# Patient Record
Sex: Male | Born: 1965 | Race: Black or African American | Hispanic: No | Smoking: Current every day smoker
Health system: Southern US, Community
[De-identification: ages and names within clinical notes are randomized; demographics above are authoritative.]

## PROBLEM LIST (undated history)

## (undated) DIAGNOSIS — F1010 Alcohol abuse, uncomplicated: Secondary | ICD-10-CM

---

## 2006-05-02 ENCOUNTER — Emergency Department (HOSPITAL_COMMUNITY): Admission: EM | Admit: 2006-05-02 | Discharge: 2006-05-02 | Payer: Self-pay | Admitting: Emergency Medicine

## 2006-09-02 ENCOUNTER — Emergency Department (HOSPITAL_COMMUNITY): Admission: EM | Admit: 2006-09-02 | Discharge: 2006-09-02 | Payer: Self-pay | Admitting: Emergency Medicine

## 2006-09-04 ENCOUNTER — Emergency Department (HOSPITAL_COMMUNITY): Admission: EM | Admit: 2006-09-04 | Discharge: 2006-09-04 | Payer: Self-pay | Admitting: *Deleted

## 2006-09-05 ENCOUNTER — Emergency Department (HOSPITAL_COMMUNITY): Admission: EM | Admit: 2006-09-05 | Discharge: 2006-09-05 | Payer: Self-pay | Admitting: Emergency Medicine

## 2007-11-13 ENCOUNTER — Emergency Department (HOSPITAL_COMMUNITY): Admission: EM | Admit: 2007-11-13 | Discharge: 2007-11-13 | Payer: Self-pay | Admitting: Emergency Medicine

## 2008-10-14 IMAGING — CR DG ANKLE COMPLETE 3+V*L*
3 series · 3 of 3 positions shown · non-contrast
Comparison: None

CLINICAL DATA: Ankle injury

LEFT ANKLE COMPLETE - 3+ VIEW

[t ankle joint lat left]
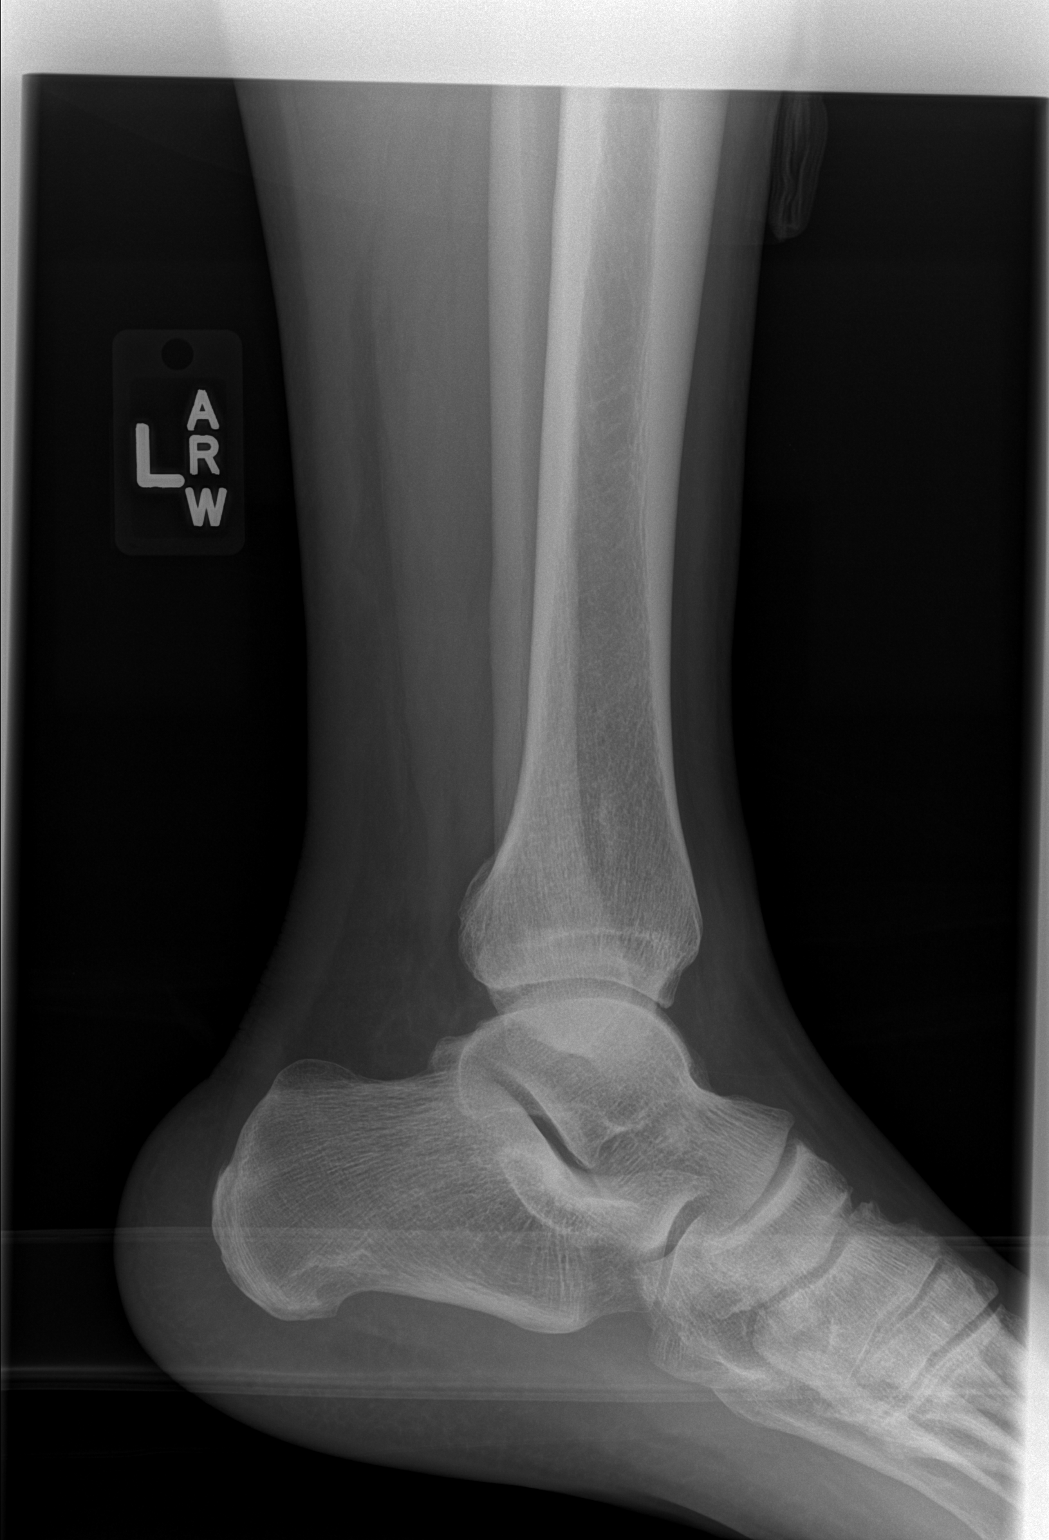

[t ankle joint ap left]
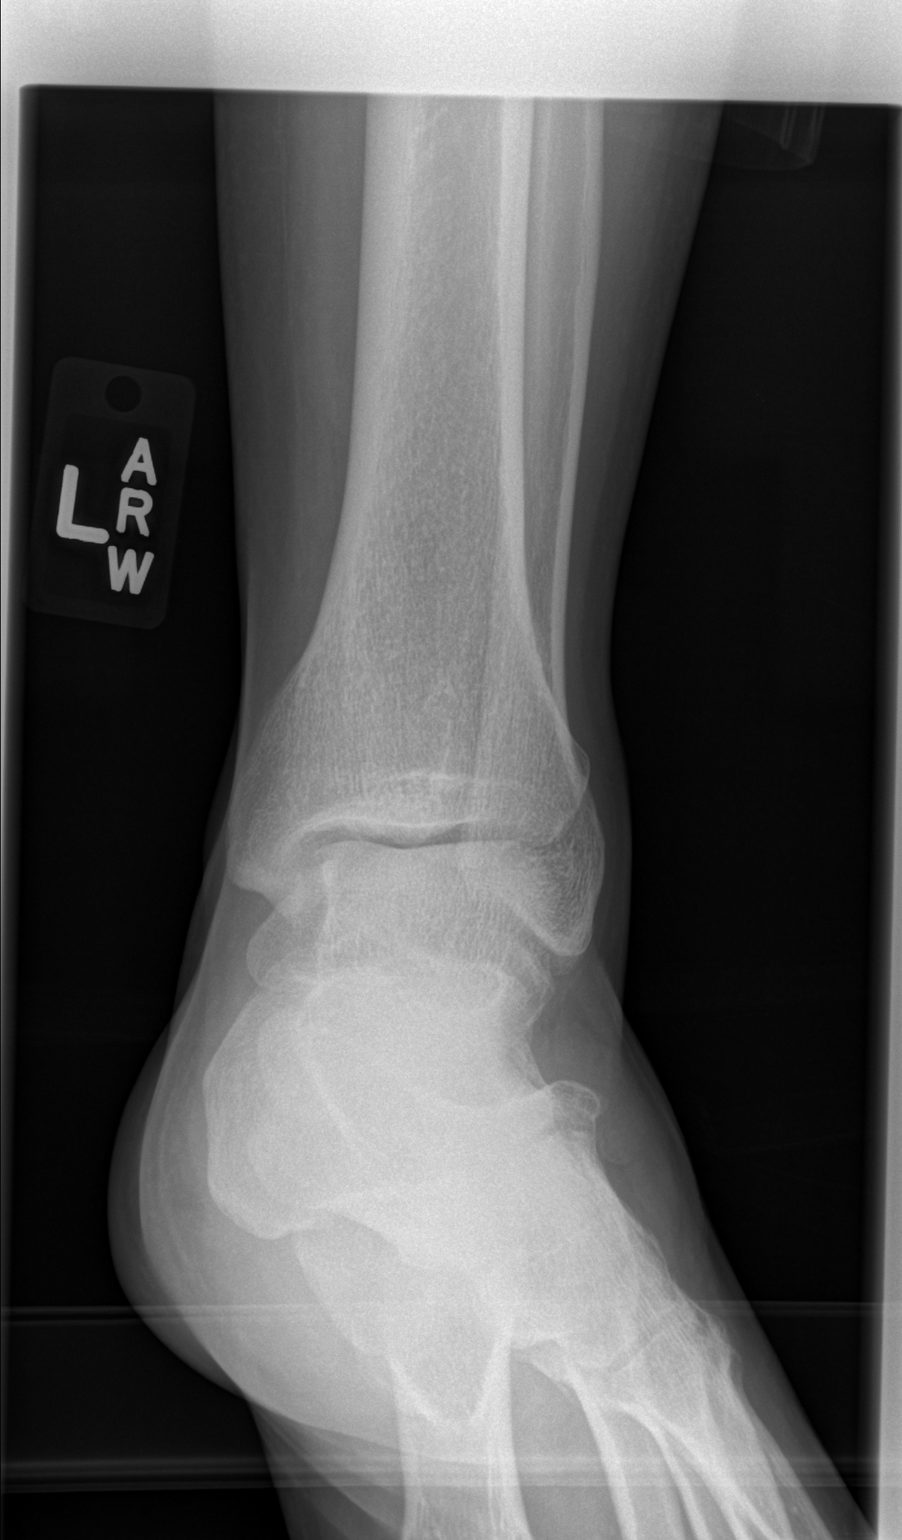

[t ankle joint oblique left]
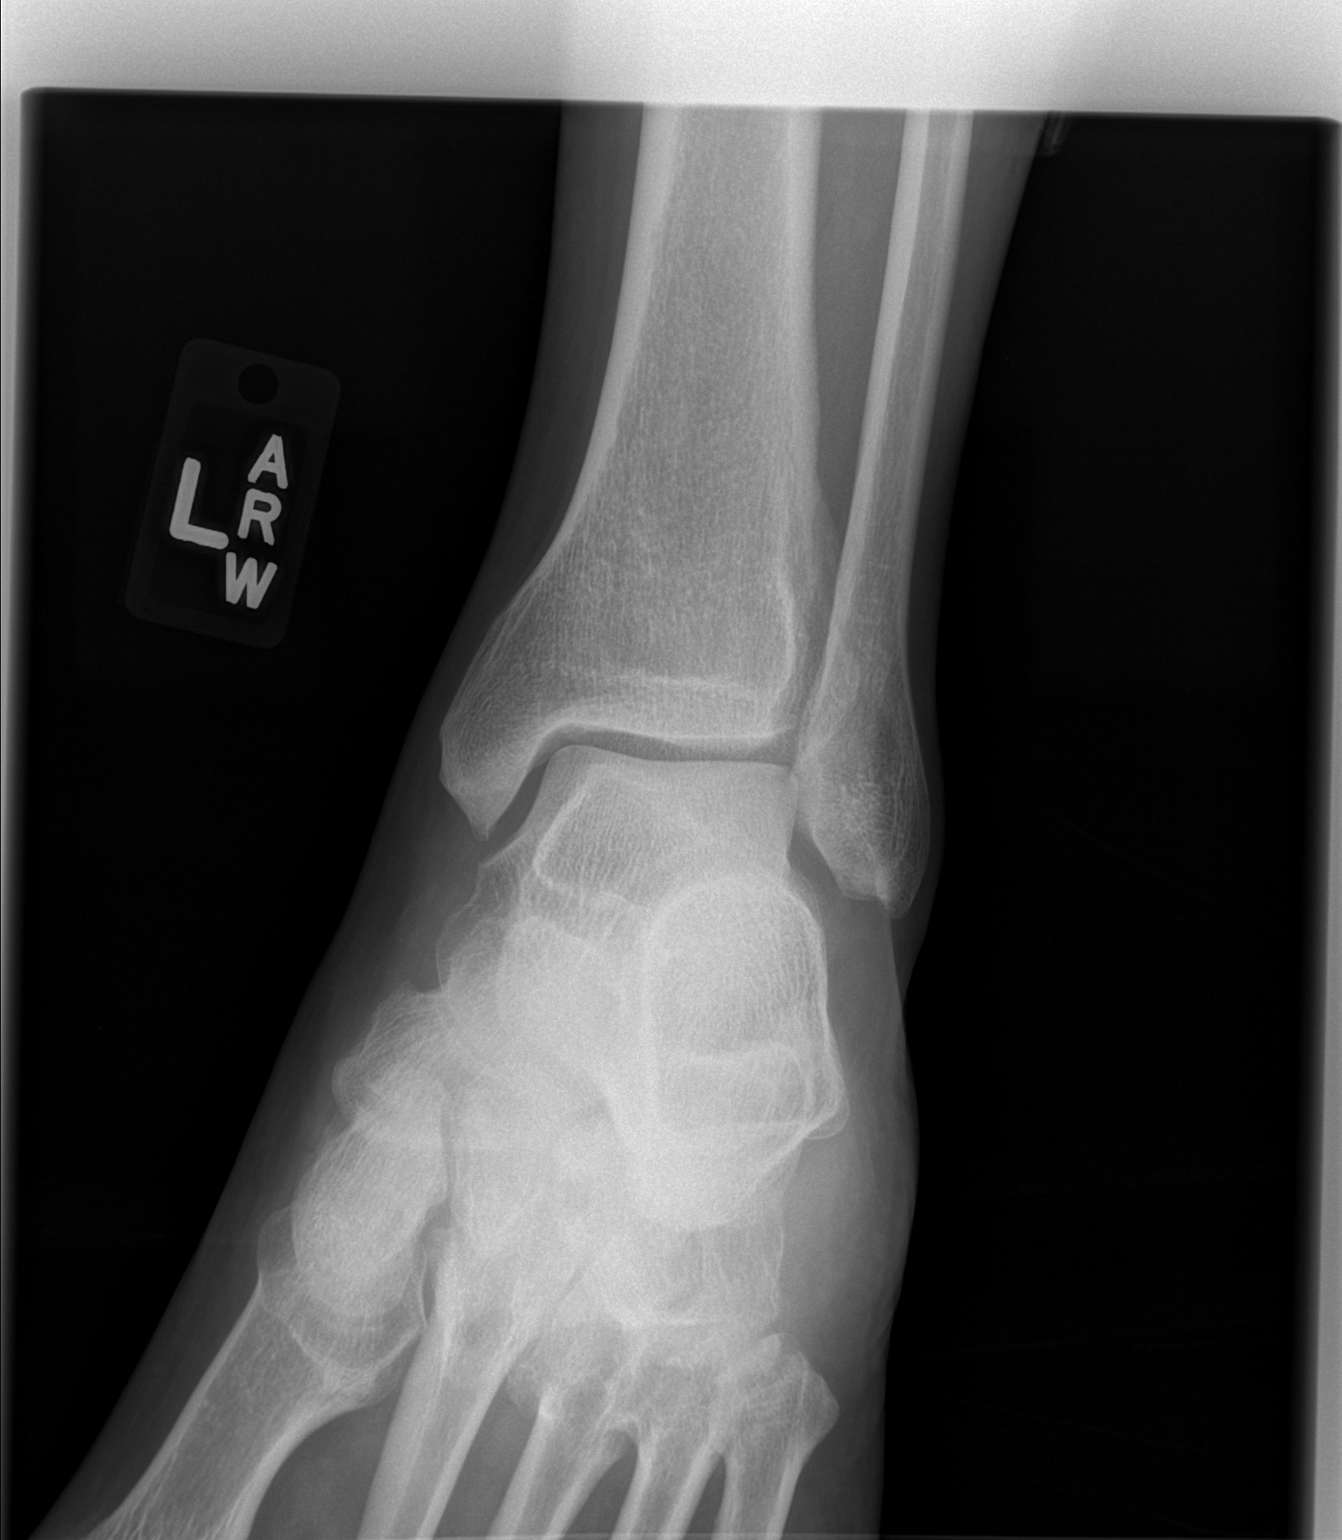

[3 of 3 positions shown; findings below may reference images not displayed]

FINDINGS: There is no evidence of acute fracture or dislocation.
No focal soft tissue swelling is demonstrated at the ankle.  There
may be soft tissue swelling over the calcaneal tuberosity.  No
significant joint effusion is seen.
IMPRESSION: No acute osseous findings.

## 2010-03-27 ENCOUNTER — Emergency Department (HOSPITAL_COMMUNITY): Admission: EM | Admit: 2010-03-27 | Discharge: 2010-03-27 | Payer: Self-pay | Admitting: Emergency Medicine

## 2011-12-02 ENCOUNTER — Emergency Department (HOSPITAL_COMMUNITY)
Admission: EM | Admit: 2011-12-02 | Discharge: 2011-12-02 | Disposition: A | Discharge status: Home / Self Care | Payer: Self-pay | Attending: Emergency Medicine | Admitting: Emergency Medicine

## 2011-12-02 ENCOUNTER — Encounter (HOSPITAL_COMMUNITY): Payer: Self-pay | Admitting: Emergency Medicine

## 2011-12-02 DIAGNOSIS — F17200 Nicotine dependence, unspecified, uncomplicated: Secondary | ICD-10-CM | POA: Insufficient documentation

## 2011-12-02 DIAGNOSIS — IMO0001 Reserved for inherently not codable concepts without codable children: Secondary | ICD-10-CM | POA: Insufficient documentation

## 2011-12-02 DIAGNOSIS — G5700 Lesion of sciatic nerve, unspecified lower limb: Secondary | ICD-10-CM | POA: Insufficient documentation

## 2011-12-02 MED ORDER — CYCLOBENZAPRINE HCL 10 MG PO TABS: 10.0000 mg | ORAL_TABLET | Freq: Three times a day (TID) | ORAL | Status: AC | PRN

## 2011-12-02 MED ORDER — IBUPROFEN 800 MG PO TABS
800.0000 mg | ORAL_TABLET | Freq: Once | ORAL | Status: AC
  Administered 2011-12-02: 800 mg via ORAL
  Filled 2011-12-02: qty 1

## 2011-12-02 MED ORDER — TRAMADOL HCL 50 MG PO TABS: 50.0000 mg | ORAL_TABLET | Freq: Four times a day (QID) | ORAL | Status: AC | PRN

## 2011-12-02 MED ORDER — NAPROXEN 500 MG PO TABS: 500.0000 mg | ORAL_TABLET | Freq: Two times a day (BID) | ORAL | Status: AC

## 2011-12-02 MED ORDER — OXYCODONE-ACETAMINOPHEN 5-325 MG PO TABS
2.0000 | ORAL_TABLET | Freq: Once | ORAL | Status: AC
  Administered 2011-12-02: 2 via ORAL
  Filled 2011-12-02: qty 2

## 2011-12-02 NOTE — Discharge Instructions (Signed)
Piriformis Syndrome Piriformis syndrome is a rare neuromuscular disorder. It happens when the piriformis muscle compresses or irritates the sciatic nerve. This is the largest nerve in the body. The piriformis muscle is a narrow muscle. It is located in the buttocks.  SYMPTOMS  Compression of the sciatic nerve causes pain. It is often described as tingling or numbness:  In the buttocks.   Along the nerve.   Down to the leg.  The pain may get worse as a result of:  Sitting for a long period of time.   Climbing stairs.   Walking or running.  TREATMENT  Generally, treatment for the disorder begins with stretching exercises and massage. Anti-inflammatory drugs may be prescribed. A patient may be advised to stop running, bicycling, or similar activities. A corticosteroid injection near where the piriformis muscle and the sciatic nerve meet may provide temporary relief. In some cases, surgery is recommended. The outcome for most individuals with this syndrome is good. Once symptoms of the disorder are addressed, individuals can usually resume their normal activities. In some cases, exercise regimens may need to be modified. This is in order to reduce the likelihood of recurrence or worsening. Document Released: 05/18/2002 Document Revised: 05/17/2011 Document Reviewed: 05/28/2005 [facility] Patient Information 2012 [place], [place].

## 2011-12-02 NOTE — ED Notes (Signed)
[id number]<AV> Expected date:<BR> Expected time:<BR> Means of arrival:<BR> Comments:<BR> 212,  EMS, leg pain

## 2011-12-02 NOTE — ED Provider Notes (Signed)
History     CSN: [id number]  Arrival date & time 12/02/11  0143   First MD Initiated Contact with Patient 12/02/11 0246      Chief Complaint  Patient presents with  . pain on left buttock     (Consider location/radiation/quality/duration/timing/severity/associated sxs/prior treatment) HPI Comments: he presents with worsening left buttock pain which is been present over the past 6 weeks. He attempted home remedies such as BenGay, heat, Tylenol without improvement of his symptoms. He is now gotten to the point where he had trouble walking today secondary to the pain. He describes it as a constant pain with intermittent sharp jolts of electricity. He has no known injury. He denies loss of bowel or bladder function. There is no paresthesias or weakness.  The history is provided by the patient. No language interpreter was used.    History reviewed. No pertinent past medical history.  History reviewed. No pertinent past surgical history.  History reviewed. No pertinent family history.  History  Substance Use Topics  . Smoking status: Current Everyday Smoker -- 1.0 packs/day for 15 years    Types: Cigarettes  . Smokeless tobacco: Not on file  . Alcohol Use:      ocassional      Review of Systems  Constitutional: Negative for fever, activity change, appetite change and fatigue.  HENT: Negative for congestion, sore throat, rhinorrhea, neck pain and neck stiffness.   Respiratory: Negative for cough and shortness of breath.   Cardiovascular: Negative for chest pain and palpitations.  Gastrointestinal: Negative for nausea, vomiting and abdominal pain.  Genitourinary: Negative for dysuria, urgency, frequency and flank pain.  Musculoskeletal: Positive for myalgias. Negative for back pain and arthralgias.  Neurological: Negative for dizziness, weakness, light-headedness, numbness and headaches.  All other systems reviewed and are negative.    Allergies  Review of patient's  allergies indicates no known allergies.  Home Medications   Current Outpatient Rx  Name Route Sig Dispense Refill  . CYCLOBENZAPRINE HCL 10 MG PO TABS Oral Take 1 tablet (10 mg total) by mouth 3 (three) times daily as needed for muscle spasms. 30 tablet 0  . NAPROXEN 500 MG PO TABS Oral Take 1 tablet (500 mg total) by mouth 2 (two) times daily. 30 tablet 0  . TRAMADOL HCL 50 MG PO TABS Oral Take 1 tablet (50 mg total) by mouth every 6 (six) hours as needed for pain. 15 tablet 0    BP 131/87  Pulse 106  Temp 98.4 F (36.9 C) (Oral)  Resp 20  Ht 6\' 3"  (1.905 m)  Wt 200 lb (90.719 kg)  BMI 25.00 kg/m2  SpO2 99%  Physical Exam  Nursing note and vitals reviewed. Constitutional: He is oriented to person, place, and time. He appears well-developed and well-nourished. No distress.  HENT:  Head: Normocephalic and atraumatic.  Mouth/Throat: Oropharynx is clear and moist. No oropharyngeal exudate.  Eyes: Conjunctivae and EOM are normal. Pupils are equal, round, and reactive to light.  Neck: Normal range of motion. Neck supple.  Cardiovascular: Normal rate, regular rhythm, normal heart sounds and intact distal pulses.  Exam reveals no gallop and no friction rub.   No murmur heard. Pulmonary/Chest: Breath sounds normal. No respiratory distress.  Abdominal: Soft. Bowel sounds are normal. There is no tenderness. There is no rebound and no guarding.  Musculoskeletal:       Tenderness over the left gluteus medius,. Foremost and obturator muscles. He has tenderness over the area of the sciatic nerve as  well. There is radiation of pain upon palpation of this area. There is tightening of the muscles.  Neurological: He is alert and oriented to person, place, and time. He has normal strength and normal reflexes. No cranial nerve deficit or sensory deficit.  Skin: Skin is warm and dry.    ED Course  Procedures (including critical care time)  Labs Reviewed - No data to display No results  found.   1. Piriformis syndrome       MDM  The symptoms are consistent with a piriformis syndrome with related sciatica. There is no indication for imaging at this time. No neurologic symptoms. He has no midline tenderness in the back. I have no concern about a malignant cause of his symptoms such as cauda equina or epidural abscess. He has no risk factors. Provided sports medicine followup.        Dayton Bailiff, MD 12/02/11 [phone]

## 2011-12-02 NOTE — ED Notes (Addendum)
Per EMS pt. Is from home with complaint  Of pain on his left buttock radiating down to the back of his left leg/ thigh and has been in pain for 6 weeks now which getting worst at this time. Denies of chest pain nor SOB.

## 2016-09-20 ENCOUNTER — Encounter: Payer: Self-pay | Admitting: *Deleted

## 2016-09-20 ENCOUNTER — Emergency Department
Admission: EM | Admit: 2016-09-20 | Discharge: 2016-09-20 | Disposition: A | Discharge status: Home / Self Care | Payer: Medicaid - Out of State | Attending: Emergency Medicine | Admitting: Emergency Medicine

## 2016-09-20 DIAGNOSIS — F1029 Alcohol dependence with unspecified alcohol-induced disorder: Secondary | ICD-10-CM

## 2016-09-20 DIAGNOSIS — R5383 Other fatigue: Secondary | ICD-10-CM | POA: Diagnosis present

## 2016-09-20 DIAGNOSIS — F331 Major depressive disorder, recurrent, moderate: Secondary | ICD-10-CM

## 2016-09-20 DIAGNOSIS — F17210 Nicotine dependence, cigarettes, uncomplicated: Secondary | ICD-10-CM | POA: Insufficient documentation

## 2016-09-20 DIAGNOSIS — Z5181 Encounter for therapeutic drug level monitoring: Secondary | ICD-10-CM | POA: Diagnosis not present

## 2016-09-20 HISTORY — DX: Alcohol abuse, uncomplicated: F10.10

## 2016-09-20 LAB — CBC WITH DIFFERENTIAL/PLATELET
BASOS ABS: 0.1 10*3/uL (ref 0–0.1)
BASOS PCT: 1 %
EOS ABS: 0.3 10*3/uL (ref 0–0.7)
Eosinophils Relative: 4 %
HCT: 46.5 % (ref 40.0–52.0)
HEMOGLOBIN: 15.5 g/dL (ref 13.0–18.0)
Lymphocytes Relative: 37 %
Lymphs Abs: 2.8 10*3/uL (ref 1.0–3.6)
MCH: 30 pg (ref 26.0–34.0)
MCHC: 33.4 g/dL (ref 32.0–36.0)
MCV: 89.9 fL (ref 80.0–100.0)
MONOS PCT: 10 %
Monocytes Absolute: 0.8 10*3/uL (ref 0.2–1.0)
NEUTROS PCT: 48 %
Neutro Abs: 3.6 10*3/uL (ref 1.4–6.5)
Platelets: 230 10*3/uL (ref 150–440)
RBC: 5.17 MIL/uL (ref 4.40–5.90)
RDW: 14.1 % (ref 11.5–14.5)
WBC: 7.5 10*3/uL (ref 3.8–10.6)

## 2016-09-20 LAB — URINE DRUG SCREEN, QUALITATIVE (ARMC ONLY)
Amphetamines, Ur Screen: NOT DETECTED
BARBITURATES, UR SCREEN: NOT DETECTED
BENZODIAZEPINE, UR SCRN: NOT DETECTED
Cannabinoid 50 Ng, Ur ~~LOC~~: POSITIVE — AB
Cocaine Metabolite,Ur ~~LOC~~: NOT DETECTED
MDMA (Ecstasy)Ur Screen: NOT DETECTED
METHADONE SCREEN, URINE: NOT DETECTED
OPIATE, UR SCREEN: NOT DETECTED
Phencyclidine (PCP) Ur S: NOT DETECTED
TRICYCLIC, UR SCREEN: NOT DETECTED

## 2016-09-20 LAB — COMPREHENSIVE METABOLIC PANEL
ALK PHOS: 78 U/L (ref 38–126)
ALT: 53 U/L (ref 17–63)
ANION GAP: 10 (ref 5–15)
AST: 53 U/L — ABNORMAL HIGH (ref 15–41)
Albumin: 4.8 g/dL (ref 3.5–5.0)
BILIRUBIN TOTAL: 1.1 mg/dL (ref 0.3–1.2)
BUN: 12 mg/dL (ref 6–20)
CALCIUM: 9.9 mg/dL (ref 8.9–10.3)
CO2: 22 mmol/L (ref 22–32)
Chloride: 107 mmol/L (ref 101–111)
Creatinine, Ser: 0.83 mg/dL (ref 0.61–1.24)
GLUCOSE: 111 mg/dL — AB (ref 65–99)
Potassium: 3.7 mmol/L (ref 3.5–5.1)
Sodium: 139 mmol/L (ref 135–145)
TOTAL PROTEIN: 8.8 g/dL — AB (ref 6.5–8.1)

## 2016-09-20 LAB — ETHANOL: ALCOHOL ETHYL (B): 59 mg/dL — AB (ref <5)

## 2016-09-20 MED ORDER — LORAZEPAM 2 MG PO TABS
2.0000 mg | ORAL_TABLET | Freq: Once | ORAL | Status: DC
  Filled 2016-09-20: qty 1

## 2016-09-20 NOTE — ED Triage Notes (Signed)
Pt reports drinking 5- 40 ounce beers a day, pt reports passing out last night , feeling fatigued, pt complains left arm pain

## 2016-09-20 NOTE — ED Notes (Signed)
Lunch was given to patient 

## 2016-09-20 NOTE — Discharge Instructions (Signed)
°  GO TO: Walk In Monday-Friday- 24 hour Facility   [facility] [address] [place] [address] [place], [place] [address] [phone]  [facility] Intake: [phone]   Please return to the emergency department for thoughts of hurting yourself or anyone else, hallucinations, shaky feeling, tremors, seizures, or any other symptoms concerning to you.

## 2016-09-20 NOTE — ED Notes (Signed)
[facility] consulting with him - I can hear the conversation  Pt reporting  "I have insurance so I cannot get into the treatment program in [place] - I was told that I can go to [facility] but I must be drunk or they won't accept me - What am I supposed to do but go get drunk and get to [facility]."   Consult completed

## 2016-09-20 NOTE — ED Provider Notes (Signed)
[facility] Emergency Department Provider Note  ____________________________________________  Time seen: Approximately 11:30 AM  I have reviewed the triage vital signs and the nursing notes.   HISTORY  Chief Complaint Fatigue and Medical Clearance    HPI Juan Briggs is a 51 y.o. male with a history of alcohol dependence presenting for desire to detox from alcohol as well as depression, the patient reports that he drinks 4-540 ounce beers daily, last drink 2 AM. He also reports that recently "a lot of people in my family have been dying" which has led him to feel depressed. He denies any SI, HI or hallucinations. He has no medical complaints at this time.   Past Medical History:  Diagnosis Date  . ETOH abuse     There are no active problems to display for this patient.   History reviewed. No pertinent surgical history.    Allergies Patient has no known allergies.  No family history on file.  Social History Social History  Substance Use Topics  . Smoking status: Current Every Day Smoker    Packs/day: 1.00    Years: 15.00    Types: Cigarettes  . Smokeless tobacco: Never Used  . Alcohol use 24.0 oz/week    40 Cans of beer per week     Comment: ocassional    Review of Systems Constitutional: No fever/chills. Eyes: No visual changes. ENT: No sore throat. No congestion or rhinorrhea. Cardiovascular: Denies chest pain. Denies palpitations. Respiratory: Denies shortness of breath.  No cough. Gastrointestinal: No abdominal pain.  No nausea, no vomiting.  No diarrhea.  No constipation. Genitourinary: Negative for dysuria. Musculoskeletal: Negative for back pain. Skin: Negative for rash. Neurological: Negative for headaches. No focal numbness, tingling or weakness.  Psychiatric:Positive sadness and depressive symptoms. No SI, HI or hallucinations. Positive alcohol abuse.  10-point ROS otherwise  negative.  ____________________________________________   PHYSICAL EXAM:  VITAL SIGNS: ED Triage Vitals  Enc Vitals Group     BP 09/20/16 1026 (!) 150/100     Pulse Rate 09/20/16 1026 (!) 126     Resp 09/20/16 1026 18     Temp 09/20/16 1026 98.9 F (37.2 C)     Temp Source 09/20/16 1026 Oral     SpO2 09/20/16 1026 98 %     Weight 09/20/16 1023 200 lb (90.7 kg)     Height 09/20/16 1023  (1.905 m)     Head Circumference --      Peak Flow --      Pain Score --      Pain Loc --      Pain Edu? --      Excl. in GC? --     Constitutional: Alert and oriented. Well appearing and in no acute distress. Answers questions appropriately. Eyes: Conjunctivae are normal.  EOMI. No scleral icterus. Head: Atraumatic. Nose: No congestion/rhinnorhea. Mouth/Throat: Mucous membranes are moist.  Neck: No stridor.  Supple.  No meningismus. Cardiovascular: Normal rate (tachy in triage, now resolved), regular rhythm. No murmurs, rubs or gallops.  Respiratory: Normal respiratory effort.  No accessory muscle use or retractions. Lungs CTAB.  No wheezes, rales or ronchi. Gastrointestinal: Overweight. Soft, nontender and nondistended.  No guarding or rebound.  No peritoneal signs. Musculoskeletal: No LE edema. Neurologic:  A&Ox3.  Speech is clear.  Face and smile are symmetric.  EOMI.  Moves all extremities well. Skin:  Skin is warm, dry and intact. No rash noted. Psychiatric: Mood is depressed and affect is flat. The  patient denies SI, HI or hallucinations to me on examination. ____________________________________________   LABS (all labs ordered are listed, but only abnormal results are displayed)  Labs Reviewed  COMPREHENSIVE METABOLIC PANEL - Abnormal; Notable for the following:       Result Value   Glucose, Bld 111 (*)    Total Protein 8.8 (*)    AST 53 (*)    All other components within normal limits  ETHANOL - Abnormal; Notable for the following:    Alcohol, Ethyl (B) 59 (*)    All  other components within normal limits  CBC WITH DIFFERENTIAL/PLATELET  URINE DRUG SCREEN, QUALITATIVE ([facility] ONLY)   ____________________________________________  EKG  Not indicated ____________________________________________  RADIOLOGY  No results found.  ____________________________________________   PROCEDURES  Procedure(s) performed: None  Procedures  Critical Care performed: No ____________________________________________   INITIAL IMPRESSION / ASSESSMENT AND PLAN / ED COURSE  Pertinent labs & imaging results that were available during my care of the patient were reviewed by me and considered in my medical decision making (see chart for details).  51 y.o. male with a history of chronic alcohol dependence and depression presenting with desire to detox from alcohol and depressive symptoms. Overall, the patient does not have any red flags psychiatric symptoms that would definitely require admission, but I will have the psychiatrist and TTS, speak with him. At this time, the patient is medically cleared for psychiatric disposition. 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

## 2016-09-20 NOTE — ED Notes (Signed)

## 2016-09-20 NOTE — ED Triage Notes (Addendum)
Pt reports fatigue that started yesterday. Pt denies any pain. Pt denies N/V. Pt also reports wanting help with alcohol detox and to speak with someone.

## 2016-09-20 NOTE — ED Notes (Signed)
Attempting to do 1300 round and pt is not in his bed - I provided his meal to him at approx 1245 and he had completed his psych consult and walked to the BR   I have checked the BR and he is not there  - his belongings are not at the head of his bed  [employer] from lobby reports that she saw a man with a blue suitcase leaving   Pt left with out discharge instructions

## 2016-09-20 NOTE — ED Notes (Signed)
BEHAVIORAL HEALTH ROUNDING Patient sleeping: No. Patient alert and oriented: yes Behavior appropriate: Yes.  ; If no, describe:  Nutrition and fluids offered: yes Toileting and hygiene offered: Yes  Sitter present: q15 minute observations and security  monitoring Law enforcement present: Yes  ODS  

## 2016-09-20 NOTE — ED Notes (Signed)
MD with discharge instructions ready - I have attempted to call phone number provided in chart to see if he wants to come back for instructions  and the number has been disconnected

## 2016-09-20 NOTE — ED Notes (Signed)
Counselor has faxed labs and face sheet to [facility] for review
